# Patient Record
Sex: Female | Born: 1980 | Race: White | Hispanic: No
Health system: Southern US, Community
[De-identification: ages and names within clinical notes are randomized; demographics above are authoritative.]

## PROBLEM LIST (undated history)

## (undated) DIAGNOSIS — F329 Major depressive disorder, single episode, unspecified: Secondary | ICD-10-CM

## (undated) DIAGNOSIS — F32A Depression, unspecified: Secondary | ICD-10-CM

## (undated) DIAGNOSIS — N200 Calculus of kidney: Secondary | ICD-10-CM

## (undated) DIAGNOSIS — F419 Anxiety disorder, unspecified: Secondary | ICD-10-CM

## (undated) DIAGNOSIS — G43909 Migraine, unspecified, not intractable, without status migrainosus: Secondary | ICD-10-CM

## (undated) HISTORY — PX: KIDNEY STONE SURGERY: SHX686

## (undated) HISTORY — DX: Migraine, unspecified, not intractable, without status migrainosus: G43.909

## (undated) HISTORY — DX: Major depressive disorder, single episode, unspecified: F32.9

## (undated) HISTORY — DX: Anxiety disorder, unspecified: F41.9

## (undated) HISTORY — DX: Depression, unspecified: F32.A

## (undated) HISTORY — DX: Calculus of kidney: N20.0

---

## 1999-07-13 HISTORY — PX: CHOLECYSTECTOMY: SHX55

## 2000-07-12 HISTORY — PX: WISDOM TOOTH EXTRACTION: SHX21

## 2012-05-22 ENCOUNTER — Other Ambulatory Visit: Payer: Self-pay | Admitting: Orthopedic Surgery

## 2012-05-22 DIAGNOSIS — M5010 Cervical disc disorder with radiculopathy, unspecified cervical region: Secondary | ICD-10-CM

## 2012-06-02 ENCOUNTER — Ambulatory Visit
Admission: RE | Admit: 2012-06-02 | Discharge: 2012-06-02 | Disposition: A | Discharge status: Home / Self Care | Payer: BC Managed Care – PPO | Source: Ambulatory Visit | Attending: Orthopedic Surgery | Admitting: Orthopedic Surgery

## 2012-06-02 DIAGNOSIS — M5010 Cervical disc disorder with radiculopathy, unspecified cervical region: Secondary | ICD-10-CM

## 2012-07-12 HISTORY — PX: ABDOMINAL HYSTERECTOMY: SHX81

## 2012-12-12 ENCOUNTER — Other Ambulatory Visit: Payer: Self-pay | Admitting: Internal Medicine

## 2012-12-12 DIAGNOSIS — E041 Nontoxic single thyroid nodule: Secondary | ICD-10-CM

## 2013-01-11 ENCOUNTER — Other Ambulatory Visit: Payer: BC Managed Care – PPO

## 2014-02-20 ENCOUNTER — Other Ambulatory Visit: Payer: BC Managed Care – PPO

## 2014-02-20 ENCOUNTER — Ambulatory Visit
Admission: RE | Admit: 2014-02-20 | Discharge: 2014-02-20 | Disposition: A | Discharge status: Home / Self Care | Payer: BC Managed Care – PPO | Source: Ambulatory Visit | Attending: Internal Medicine | Admitting: Internal Medicine

## 2014-02-20 ENCOUNTER — Other Ambulatory Visit: Payer: Self-pay | Admitting: Internal Medicine

## 2014-02-20 ENCOUNTER — Encounter (INDEPENDENT_AMBULATORY_CARE_PROVIDER_SITE_OTHER): Payer: Self-pay

## 2014-02-20 DIAGNOSIS — R609 Edema, unspecified: Secondary | ICD-10-CM

## 2014-04-09 ENCOUNTER — Other Ambulatory Visit: Payer: Self-pay | Admitting: Internal Medicine

## 2014-04-09 DIAGNOSIS — E041 Nontoxic single thyroid nodule: Secondary | ICD-10-CM

## 2014-04-22 ENCOUNTER — Other Ambulatory Visit: Payer: BC Managed Care – PPO

## 2016-03-16 ENCOUNTER — Other Ambulatory Visit: Payer: Self-pay | Admitting: Physician Assistant

## 2016-03-16 ENCOUNTER — Ambulatory Visit
Admission: RE | Admit: 2016-03-16 | Discharge: 2016-03-16 | Disposition: A | Discharge status: Home / Self Care | Payer: BLUE CROSS/BLUE SHIELD | Source: Ambulatory Visit | Attending: Physician Assistant | Admitting: Physician Assistant

## 2016-03-16 ENCOUNTER — Other Ambulatory Visit: Payer: Self-pay

## 2016-03-16 DIAGNOSIS — M545 Low back pain: Secondary | ICD-10-CM

## 2017-02-21 IMAGING — CR DG LUMBAR SPINE COMPLETE 4+V
5 series · 5 of 5 positions shown · non-contrast
Comparison: None.

CLINICAL DATA: Three-month history of right-sided sciatica

EXAM:
LUMBAR SPINE - COMPLETE 4+ VIEW

[w lumbar spine ap]
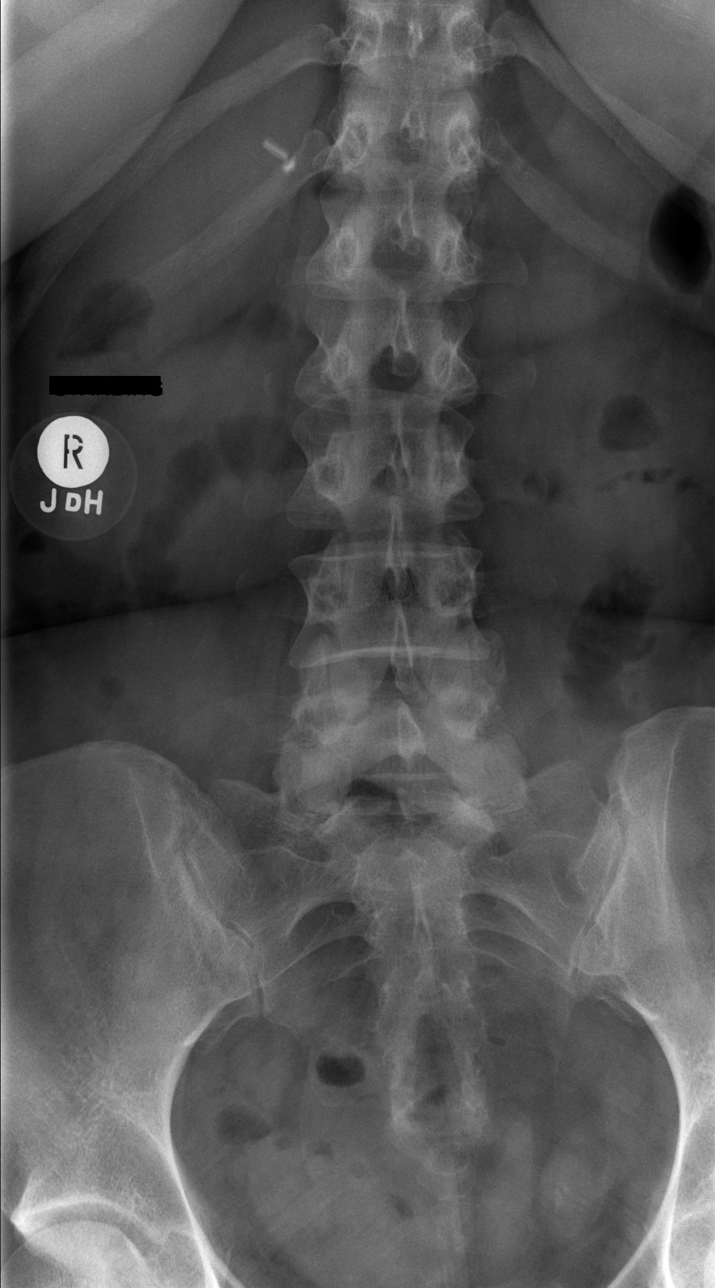

[w lumbar spine obl (1 of 2)]
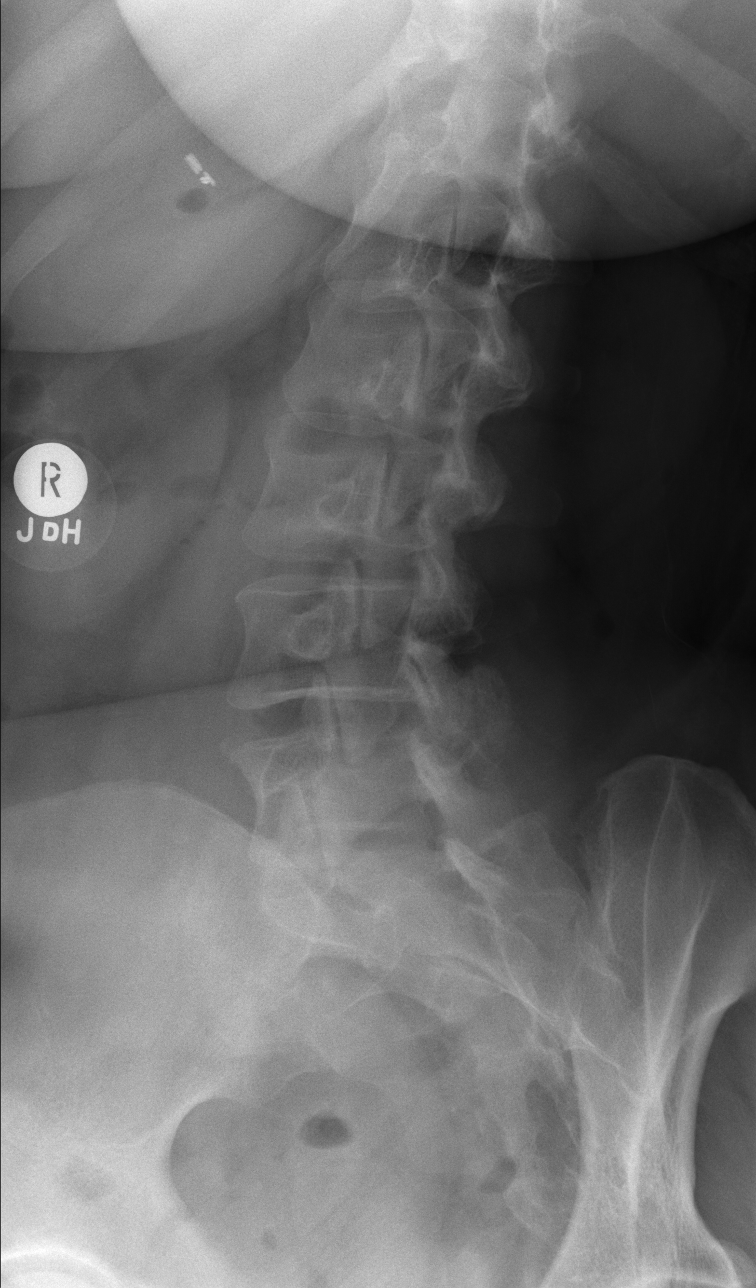

[w lumbar spine obl (2 of 2)]
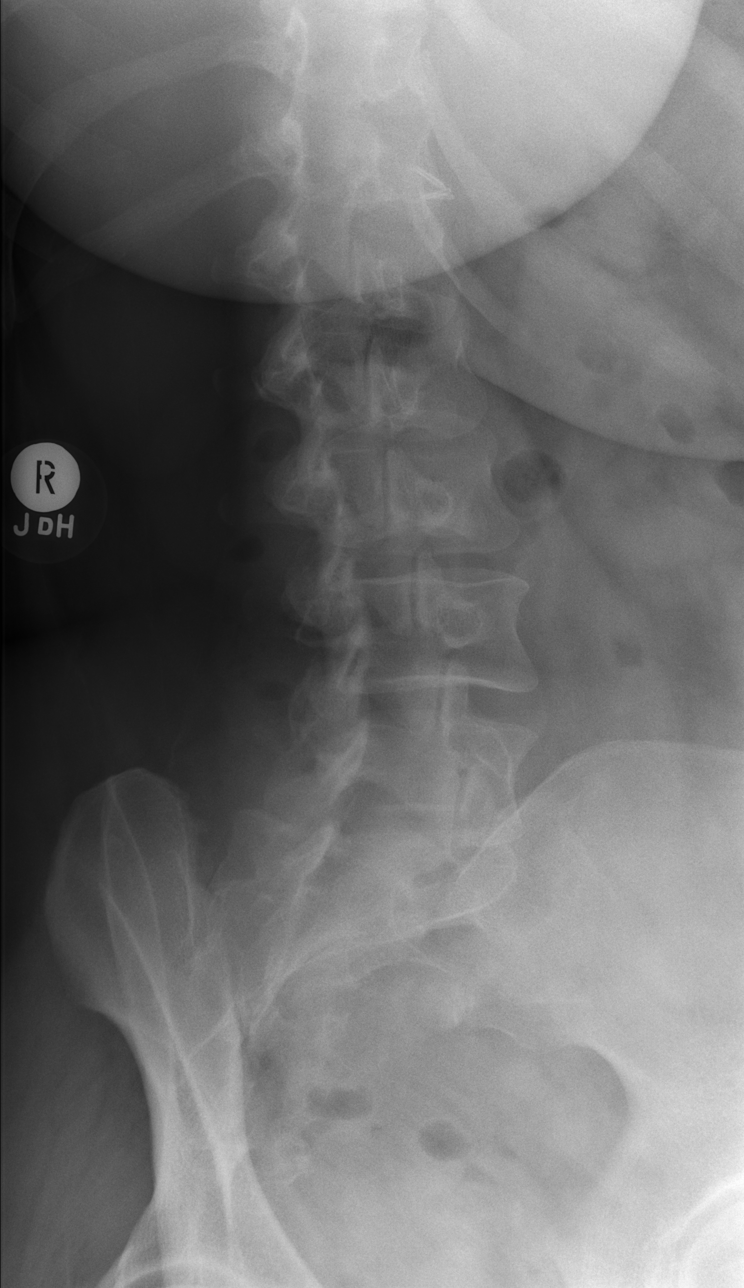

[w lumbar spine lat]
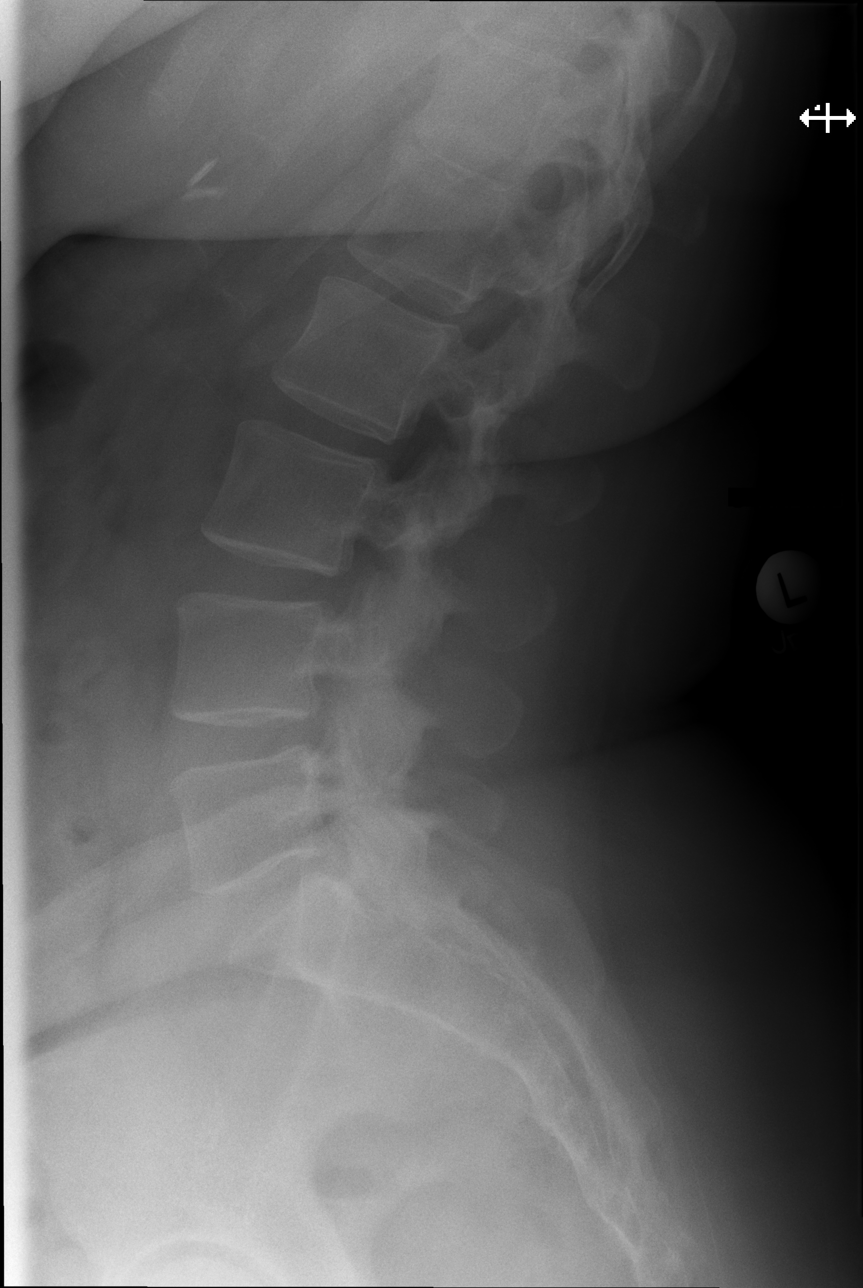

[w lumbar l-5 s-1 spot]
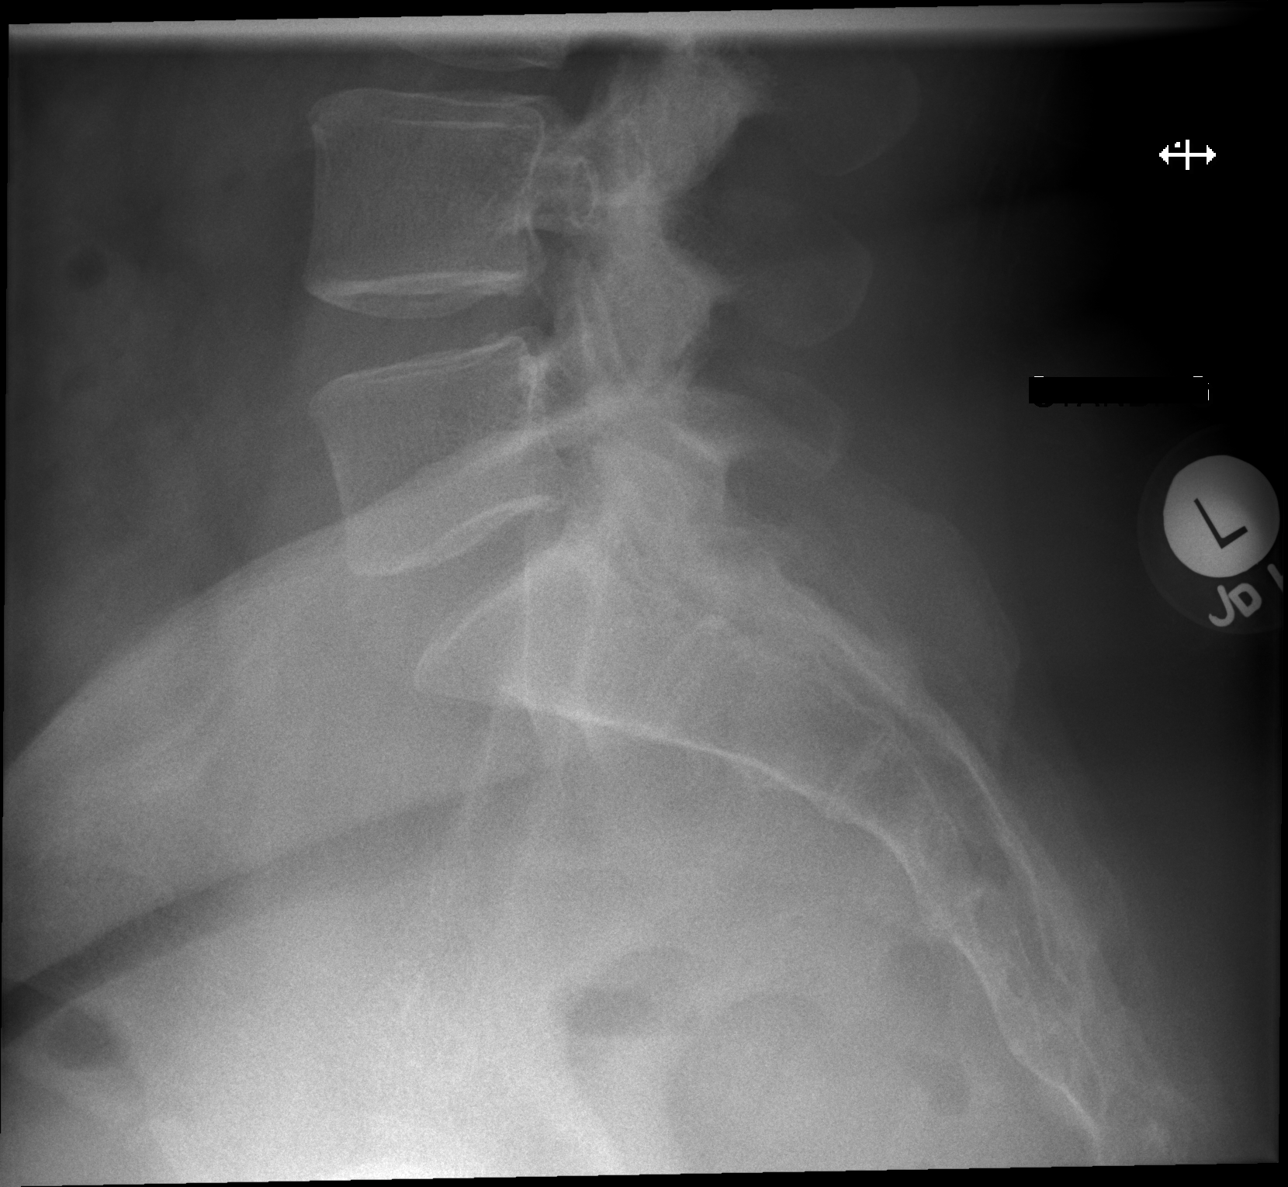

[5 of 5 positions shown; findings below may reference images not displayed]

FINDINGS: Standing frontal, standing lateral, standing spot lumbosacral
lateral, and bilateral oblique views were obtained. There are 5
non-rib-bearing lumbar type vertebral bodies. There is slight
dextroscoliosis. There is no fracture or spondylolisthesis. The disc
spaces appear normal. There is no appreciable facet arthropathy.
IMPRESSION: Mild scoliosis. No fracture or spondylolisthesis. No apparent
arthropathy.

## 2017-10-04 ENCOUNTER — Ambulatory Visit: Payer: Self-pay | Admitting: Diagnostic Neuroimaging

## 2017-10-12 ENCOUNTER — Telehealth: Payer: Self-pay | Admitting: *Deleted

## 2017-10-12 ENCOUNTER — Other Ambulatory Visit: Payer: Self-pay

## 2017-10-12 ENCOUNTER — Encounter: Payer: Self-pay | Admitting: Neurology

## 2017-10-12 ENCOUNTER — Ambulatory Visit: Payer: Commercial Managed Care - PPO | Admitting: Neurology

## 2017-10-12 VITALS — BP 137/96 | HR 104 | Ht 67.0 in | Wt 237.0 lb

## 2017-10-12 DIAGNOSIS — G8929 Other chronic pain: Secondary | ICD-10-CM | POA: Diagnosis not present

## 2017-10-12 DIAGNOSIS — M542 Cervicalgia: Secondary | ICD-10-CM | POA: Diagnosis not present

## 2017-10-12 DIAGNOSIS — M5441 Lumbago with sciatica, right side: Secondary | ICD-10-CM

## 2017-10-12 MED ORDER — RIZATRIPTAN BENZOATE 10 MG PO TABS: 10.0000 mg | ORAL_TABLET | Freq: Three times a day (TID) | ORAL | 3 refills | Status: AC | PRN

## 2017-10-12 MED ORDER — GABAPENTIN 600 MG PO TABS: 600.0000 mg | ORAL_TABLET | Freq: Three times a day (TID) | ORAL | 3 refills | Status: DC

## 2017-10-12 NOTE — Patient Instructions (Signed)
   We will start gabapentin 600 mg three times a day.  We will get EMG and NCV study to look at the nerve function of the right leg and left arm.  Use Maxalt if needed for the headache.
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
## 2017-10-12 NOTE — Telephone Encounter (Signed)
Contacted pt. She was on wait list for sooner appt. Scheduled appt today at 4pm with Dr. Anne HahnWillis. Advised her to bring insurance cards, updated med list with her. She verbalized understanding and appreciation for call. Cx appt that was previously on 12/01/17.

## 2017-11-15 ENCOUNTER — Telehealth: Payer: Self-pay | Admitting: Neurology

## 2017-11-15 ENCOUNTER — Encounter: Payer: Commercial Managed Care - PPO | Admitting: Neurology

## 2017-11-15 NOTE — Telephone Encounter (Signed)
This patient did not show for an EMG and nerve conduction study performed today, she canceled same day of the appointment.

## 2017-11-16 ENCOUNTER — Encounter: Payer: Self-pay | Admitting: Neurology

## 2017-12-01 ENCOUNTER — Ambulatory Visit: Payer: Self-pay | Admitting: Neurology

## 2017-12-01 ENCOUNTER — Encounter

## 2017-12-13 ENCOUNTER — Ambulatory Visit: Payer: Commercial Managed Care - PPO | Admitting: Neurology

## 2017-12-13 ENCOUNTER — Encounter: Payer: Self-pay | Admitting: Neurology

## 2017-12-13 ENCOUNTER — Ambulatory Visit (INDEPENDENT_AMBULATORY_CARE_PROVIDER_SITE_OTHER): Payer: Commercial Managed Care - PPO | Admitting: Neurology

## 2017-12-13 DIAGNOSIS — G8929 Other chronic pain: Secondary | ICD-10-CM

## 2017-12-13 DIAGNOSIS — M5441 Lumbago with sciatica, right side: Secondary | ICD-10-CM | POA: Diagnosis not present

## 2017-12-13 DIAGNOSIS — M542 Cervicalgia: Secondary | ICD-10-CM

## 2017-12-13 NOTE — Procedures (Signed)
HISTORY:  Jane Mueller is a 37 year old patient with a history of chronic neck pain and left shoulder and arm discomfort, and more recently she has developed low back pain and pain down the right leg to behind the knee.  The patient is being evaluated for these issues.  NERVE CONDUCTION STUDIES:  Nerve conduction studies were performed on the left upper extremity. The distal motor latencies and motor amplitudes for the median and ulnar nerves were within normal limits. The nerve conduction velocities for these nerves were also normal. The sensory latencies for the median and ulnar nerves were normal.  The F-wave latency for the left ulnar nerve was normal.  Nerve conduction studies were performed on both lower extremities. The distal motor latencies and motor amplitudes for the peroneal and posterior tibial nerves were within normal limits. The nerve conduction velocities for these nerves were also normal. The sensory latencies for the peroneal and sural nerves were within normal limits.  The F-wave latencies for the posterior tibial nerves were within normal limits bilaterally.   EMG STUDIES:  EMG study was performed on the left upper extremity:  The first dorsal interosseous muscle reveals 2 to 4 K units with full recruitment. No fibrillations or positive waves were noted. The abductor pollicis brevis muscle reveals 2 to 4 K units with full recruitment. No fibrillations or positive waves were noted. The extensor indicis proprius muscle reveals 1 to 3 K units with full recruitment. No fibrillations or positive waves were noted. The pronator teres muscle reveals 2 to 3 K units with full recruitment. No fibrillations or positive waves were noted. The biceps muscle reveals 1 to 2 K units with full recruitment. No fibrillations or positive waves were noted. The triceps muscle reveals 2 to 4 K units with full recruitment. No fibrillations or positive waves were noted. The anterior deltoid  muscle reveals 2 to 3 K units with full recruitment. No fibrillations or positive waves were noted. The cervical paraspinal muscles were tested at 2 levels. No abnormalities of insertional activity were seen at either level tested. There was good relaxation.  EMG study was performed on the right lower extremity:  The tibialis anterior muscle reveals 2 to 4K motor units with full recruitment. No fibrillations or positive waves were seen. The peroneus tertius muscle reveals 2 to 4K motor units with full recruitment. No fibrillations or positive waves were seen. The medial gastrocnemius muscle reveals 1 to 3K motor units with full recruitment. No fibrillations or positive waves were seen. The vastus lateralis muscle reveals 2 to 4K motor units with full recruitment. No fibrillations or positive waves were seen. The iliopsoas muscle reveals 2 to 4K motor units with full recruitment. No fibrillations or positive waves were seen. The biceps femoris muscle (long head) reveals 2 to 4K motor units with full recruitment. No fibrillations or positive waves were seen. The lumbosacral paraspinal muscles were tested at 3 levels, and revealed no abnormalities of insertional activity at all 3 levels tested. There was good relaxation.   IMPRESSION:  Nerve conduction studies were performed on the left upper extremity and on both lower extremities and were completely within normal limits, no evidence of a neuropathy is seen.  EMG evaluation of the left upper extremity was within normal limits, no evidence of a cervical radiculopathy was seen.  EMG study of the right lower extremity was within normal limits, no evidence of a lumbar radiculopathy was noted.  Jane Mueller. Keith Willis MD 12/13/2017 1:57 PM  [facility] [address] [place], [place] [address]  Phone [phone] Fax [phone]

## 2017-12-13 NOTE — Progress Notes (Addendum)
The patient comes in today for EMG and nerve conduction study evaluation.  The patient has ongoing back pain and pain down the right leg to the knee posteriorly.  She continues to complain of neck pain and left shoulder and arm discomfort.  EMG nerve conduction study evaluations were completely normal without evidence of neuropathy or a left cervical or right lumbar radiculopathy.  Patient will be set up for an epidural steroid injection of the cervical spine, we will get MRI evaluation of the lumbar spine.     MNC    Nerve / Sites Muscle Latency Ref. Amplitude Ref. Rel Amp Segments Distance Velocity Ref. Area    ms ms mV mV %  cm m/s m/s mVms  L Median - APB     Wrist APB 2.7 ?4.4 8.1 ?4.0 100 Wrist - APB 7   27.1     Upper arm APB 6.1  7.9  96.9 Upper arm - Wrist 18 53 ?49 26.9  L Ulnar - ADM     Wrist ADM 2.2 ?3.3 8.3 ?6.0 100 Wrist - ADM 7   24.7     B.Elbow ADM 5.7  7.7  91.9 B.Elbow - Wrist 19 54 ?49 22.7     A.Elbow ADM 7.4  7.6  99.5 A.Elbow - B.Elbow 10 56 ?49 22.9         A.Elbow - Wrist      R Peroneal - EDB     Ankle EDB 4.4 ?6.5 4.4 ?2.0 100 Ankle - EDB 9   23.6     Fib head EDB 10.8  4.2  94.6 Fib head - Ankle 31 48 ?44 21.8     Pop fossa EDB 13.0  3.9  94 Pop fossa - Fib head 10 46 ?44 21.3         Pop fossa - Ankle      L Peroneal - EDB     Ankle EDB 4.6 ?6.5 4.0 ?2.0 100 Ankle - EDB 9   20.8     Fib head EDB 11.8  4.0  101 Fib head - Ankle 32 45 ?44 20.3     Pop fossa EDB 14.0  3.8  94.7 Pop fossa - Fib head 10 46 ?44 19.0         Pop fossa - Ankle      R Tibial - AH     Ankle AH 3.9 ?5.8 9.8 ?4.0 100 Ankle - AH 9   34.9     Pop fossa AH 12.2  8.4  85 Pop fossa - Ankle 35 42 ?41 32.7  L Tibial - AH     Ankle AH 4.0 ?5.8 10.9 ?4.0 100 Ankle - AH 9   34.2     Pop fossa AH 12.2  8.9  82 Pop fossa - Ankle 35 43 ?41 32.5                 SNC    Nerve / Sites Rec. Site Peak Lat Ref.  Amp Ref. Segments Distance    ms ms V V  cm  R Sural - Ankle (Calf)     Calf  Ankle 3.4 ?4.4 10 ?6 Calf - Ankle 14  L Sural - Ankle (Calf)     Calf Ankle 3.5 ?4.4 9 ?6 Calf - Ankle 14  R Superficial peroneal - Ankle     Lat leg Ankle 3.5 ?4.4 6 ?6 Lat leg - Ankle 14  L Superficial peroneal - Ankle  Lat leg Ankle 3.8 ?4.4 6 ?6 Lat leg - Ankle 14  L Median - Orthodromic (Dig II, Mid palm)     Dig II Wrist 2.5 ?3.4 17 ?10 Dig II - Wrist 13  L Ulnar - Orthodromic, (Dig V, Mid palm)     Dig V Wrist 2.3 ?3.1 9 ?5 Dig V - Wrist 72                 F  Wave    Nerve F Lat Ref.   ms ms  R Tibial - AH 48.8 ?56.0  L Tibial - AH 48.5 ?56.0  L Ulnar - ADM 25.3 ?32.0           EMG full

## 2017-12-13 NOTE — Progress Notes (Signed)
Please refer to EMG and nerve conduction study procedure note. 

## 2017-12-14 ENCOUNTER — Other Ambulatory Visit: Payer: Self-pay | Admitting: Neurology

## 2017-12-14 DIAGNOSIS — M542 Cervicalgia: Secondary | ICD-10-CM

## 2017-12-15 ENCOUNTER — Telehealth: Payer: Self-pay | Admitting: Neurology

## 2017-12-15 NOTE — Telephone Encounter (Signed)
lvm for pt to call back about scheduling mri   [facility] Auth: [id number] (exp. 12/14/17 to 01/12/18)

## 2018-02-02 ENCOUNTER — Other Ambulatory Visit: Payer: Self-pay | Admitting: Neurology

## 2018-02-20 ENCOUNTER — Encounter: Payer: Self-pay | Admitting: Neurology

## 2018-02-20 ENCOUNTER — Ambulatory Visit: Payer: Commercial Managed Care - PPO | Admitting: Neurology

## 2018-02-20 ENCOUNTER — Telehealth: Payer: Self-pay | Admitting: Neurology

## 2018-02-20 NOTE — Telephone Encounter (Signed)
This patient did not show for a revisit appointment today. 

## 2018-04-05 ENCOUNTER — Other Ambulatory Visit: Payer: Self-pay | Admitting: Neurology

## 2018-06-17 ENCOUNTER — Other Ambulatory Visit: Payer: Self-pay | Admitting: Neurology

## 2018-07-18 ENCOUNTER — Other Ambulatory Visit: Payer: Self-pay | Admitting: Neurology

## 2018-10-18 ENCOUNTER — Other Ambulatory Visit: Payer: Self-pay | Admitting: Neurology

## 2018-11-16 ENCOUNTER — Telehealth: Payer: Self-pay

## 2018-11-16 ENCOUNTER — Telehealth: Payer: Self-pay | Admitting: Neurology

## 2018-11-16 NOTE — Telephone Encounter (Signed)
LMVM for pt to return call re: request for gabapentin (MObile).

## 2018-11-16 NOTE — Telephone Encounter (Signed)
Rx has been denied for gabapentin due to it being 1 year since last appt. Pt will need to schedule visit before refill will be given. Please schedule once pt call back with Dr. Anne Hahn or Lindley Magnus, NP. Virtual video visit can be offered.

## 2018-11-20 NOTE — Telephone Encounter (Signed)
Pt has returned the call to RN Sandy, she is asking for a call back °

## 2018-11-21 NOTE — Telephone Encounter (Signed)
I called back, LMVM for pt to make appt as received request for gabapentin.  Last seen 10/2017.

## 2018-11-22 ENCOUNTER — Other Ambulatory Visit: Payer: Self-pay | Admitting: Neurology

## 2021-10-09 ENCOUNTER — Encounter: Payer: Self-pay | Admitting: Neurology
